# Patient Record
Sex: Female | Born: 1950 | Race: White | Hispanic: No | Smoking: Never smoker
Health system: Southern US, Community
[De-identification: ages and names within clinical notes are randomized; demographics above are authoritative.]

## PROBLEM LIST (undated history)

## (undated) DIAGNOSIS — M1990 Unspecified osteoarthritis, unspecified site: Secondary | ICD-10-CM

## (undated) DIAGNOSIS — I509 Heart failure, unspecified: Secondary | ICD-10-CM

## (undated) DIAGNOSIS — G4730 Sleep apnea, unspecified: Secondary | ICD-10-CM

## (undated) DIAGNOSIS — J45909 Unspecified asthma, uncomplicated: Secondary | ICD-10-CM

## (undated) DIAGNOSIS — K219 Gastro-esophageal reflux disease without esophagitis: Secondary | ICD-10-CM

## (undated) DIAGNOSIS — I2510 Atherosclerotic heart disease of native coronary artery without angina pectoris: Secondary | ICD-10-CM

## (undated) DIAGNOSIS — I10 Essential (primary) hypertension: Secondary | ICD-10-CM

## (undated) DIAGNOSIS — E119 Type 2 diabetes mellitus without complications: Secondary | ICD-10-CM

## (undated) HISTORY — PX: ABDOMINAL HYSTERECTOMY: SHX81

## (undated) HISTORY — PX: TONSILLECTOMY: SUR1361

## (undated) HISTORY — PX: HEMORROIDECTOMY: SUR656

---

## 2015-12-10 ENCOUNTER — Emergency Department (HOSPITAL_COMMUNITY): Payer: Medicare HMO

## 2015-12-10 ENCOUNTER — Inpatient Hospital Stay (HOSPITAL_COMMUNITY)
Admission: EM | Admit: 2015-12-10 | Discharge: 2015-12-12 | DRG: 247 | Disposition: A | Discharge status: Home / Self Care | Payer: Medicare HMO | Attending: Cardiology | Admitting: Cardiology

## 2015-12-10 ENCOUNTER — Encounter (HOSPITAL_COMMUNITY): Payer: Self-pay | Admitting: *Deleted

## 2015-12-10 DIAGNOSIS — R079 Chest pain, unspecified: Secondary | ICD-10-CM | POA: Diagnosis not present

## 2015-12-10 DIAGNOSIS — I214 Non-ST elevation (NSTEMI) myocardial infarction: Principal | ICD-10-CM

## 2015-12-10 DIAGNOSIS — Z885 Allergy status to narcotic agent status: Secondary | ICD-10-CM | POA: Diagnosis not present

## 2015-12-10 DIAGNOSIS — I10 Essential (primary) hypertension: Secondary | ICD-10-CM | POA: Diagnosis present

## 2015-12-10 DIAGNOSIS — Z6839 Body mass index (BMI) 39.0-39.9, adult: Secondary | ICD-10-CM | POA: Diagnosis not present

## 2015-12-10 DIAGNOSIS — Z955 Presence of coronary angioplasty implant and graft: Secondary | ICD-10-CM | POA: Diagnosis not present

## 2015-12-10 DIAGNOSIS — Z880 Allergy status to penicillin: Secondary | ICD-10-CM

## 2015-12-10 DIAGNOSIS — I2510 Atherosclerotic heart disease of native coronary artery without angina pectoris: Secondary | ICD-10-CM

## 2015-12-10 DIAGNOSIS — I25110 Atherosclerotic heart disease of native coronary artery with unstable angina pectoris: Secondary | ICD-10-CM | POA: Diagnosis present

## 2015-12-10 DIAGNOSIS — E876 Hypokalemia: Secondary | ICD-10-CM | POA: Diagnosis present

## 2015-12-10 DIAGNOSIS — K219 Gastro-esophageal reflux disease without esophagitis: Secondary | ICD-10-CM | POA: Diagnosis present

## 2015-12-10 DIAGNOSIS — Z7984 Long term (current) use of oral hypoglycemic drugs: Secondary | ICD-10-CM | POA: Diagnosis not present

## 2015-12-10 DIAGNOSIS — E785 Hyperlipidemia, unspecified: Secondary | ICD-10-CM

## 2015-12-10 DIAGNOSIS — E119 Type 2 diabetes mellitus without complications: Secondary | ICD-10-CM

## 2015-12-10 DIAGNOSIS — Z8249 Family history of ischemic heart disease and other diseases of the circulatory system: Secondary | ICD-10-CM | POA: Diagnosis not present

## 2015-12-10 DIAGNOSIS — M3390 Dermatopolymyositis, unspecified, organ involvement unspecified: Secondary | ICD-10-CM

## 2015-12-10 DIAGNOSIS — Z79899 Other long term (current) drug therapy: Secondary | ICD-10-CM | POA: Diagnosis not present

## 2015-12-10 DIAGNOSIS — Z7982 Long term (current) use of aspirin: Secondary | ICD-10-CM | POA: Diagnosis not present

## 2015-12-10 DIAGNOSIS — E669 Obesity, unspecified: Secondary | ICD-10-CM | POA: Diagnosis present

## 2015-12-10 DIAGNOSIS — M3313 Other dermatomyositis without myopathy: Secondary | ICD-10-CM

## 2015-12-10 HISTORY — DX: Sleep apnea, unspecified: G47.30

## 2015-12-10 HISTORY — DX: Type 2 diabetes mellitus without complications: E11.9

## 2015-12-10 HISTORY — DX: Gastro-esophageal reflux disease without esophagitis: K21.9

## 2015-12-10 HISTORY — DX: Unspecified asthma, uncomplicated: J45.909

## 2015-12-10 HISTORY — DX: Essential (primary) hypertension: I10

## 2015-12-10 HISTORY — DX: Atherosclerotic heart disease of native coronary artery without angina pectoris: I25.10

## 2015-12-10 HISTORY — DX: Heart failure, unspecified: I50.9

## 2015-12-10 HISTORY — DX: Unspecified osteoarthritis, unspecified site: M19.90

## 2015-12-10 LAB — CBC
HEMATOCRIT: 40.6 % (ref 36.0–46.0)
Hemoglobin: 13.3 g/dL (ref 12.0–15.0)
MCH: 28.1 pg (ref 26.0–34.0)
MCHC: 32.8 g/dL (ref 30.0–36.0)
MCV: 85.8 fL (ref 78.0–100.0)
PLATELETS: 287 10*3/uL (ref 150–400)
RBC: 4.73 MIL/uL (ref 3.87–5.11)
RDW: 13.6 % (ref 11.5–15.5)
WBC: 7.6 10*3/uL (ref 4.0–10.5)

## 2015-12-10 LAB — APTT: APTT: 32 s (ref 24–36)

## 2015-12-10 LAB — BASIC METABOLIC PANEL
Anion gap: 9 (ref 5–15)
BUN: 17 mg/dL (ref 6–20)
CO2: 28 mmol/L (ref 22–32)
CREATININE: 1.13 mg/dL — AB (ref 0.44–1.00)
Calcium: 9.6 mg/dL (ref 8.9–10.3)
Chloride: 100 mmol/L — ABNORMAL LOW (ref 101–111)
GFR calc Af Amer: 57 mL/min — ABNORMAL LOW (ref >60)
GFR, EST NON AFRICAN AMERICAN: 50 mL/min — AB (ref >60)
GLUCOSE: 93 mg/dL (ref 65–99)
POTASSIUM: 3.3 mmol/L — AB (ref 3.5–5.1)
SODIUM: 137 mmol/L (ref 135–145)

## 2015-12-10 LAB — TROPONIN I: Troponin I: 0.12 ng/mL (ref <0.03)

## 2015-12-10 LAB — PROTIME-INR
INR: 1.06
Prothrombin Time: 13.8 seconds (ref 11.4–15.2)

## 2015-12-10 LAB — I-STAT TROPONIN, ED: Troponin i, poc: 0.1 ng/mL (ref 0.00–0.08)

## 2015-12-10 MED ORDER — MOMETASONE FURO-FORMOTEROL FUM 200-5 MCG/ACT IN AERO
2.0000 | INHALATION_SPRAY | Freq: Two times a day (BID) | RESPIRATORY_TRACT | Status: DC
  Administered 2015-12-11 – 2015-12-12 (×3): 2 via RESPIRATORY_TRACT
  Filled 2015-12-10 (×2): qty 8.8

## 2015-12-10 MED ORDER — ATORVASTATIN CALCIUM 80 MG PO TABS
80.0000 mg | ORAL_TABLET | Freq: Every day | ORAL | Status: DC
  Administered 2015-12-10 – 2015-12-11 (×2): 80 mg via ORAL
  Filled 2015-12-10 (×2): qty 1

## 2015-12-10 MED ORDER — ASPIRIN EC 81 MG PO TBEC: 81.0000 mg | DELAYED_RELEASE_TABLET | Freq: Every day | ORAL | Status: DC

## 2015-12-10 MED ORDER — POTASSIUM CHLORIDE CRYS ER 20 MEQ PO TBCR
40.0000 meq | EXTENDED_RELEASE_TABLET | Freq: Once | ORAL | Status: AC
  Administered 2015-12-10: 40 meq via ORAL
  Filled 2015-12-10: qty 2

## 2015-12-10 MED ORDER — CARVEDILOL 25 MG PO TABS
25.0000 mg | ORAL_TABLET | Freq: Two times a day (BID) | ORAL | Status: DC
  Administered 2015-12-10 – 2015-12-12 (×4): 25 mg via ORAL
  Filled 2015-12-10: qty 1
  Filled 2015-12-10 (×3): qty 2

## 2015-12-10 MED ORDER — HEPARIN (PORCINE) IN NACL 100-0.45 UNIT/ML-% IJ SOLN
1100.0000 [IU]/h | INTRAMUSCULAR | Status: DC
  Administered 2015-12-10: 1100 [IU]/h via INTRAVENOUS
  Filled 2015-12-10: qty 250

## 2015-12-10 MED ORDER — ONDANSETRON HCL 4 MG/2ML IJ SOLN: 4.0000 mg | Freq: Four times a day (QID) | INTRAMUSCULAR | Status: DC | PRN

## 2015-12-10 MED ORDER — SODIUM CHLORIDE 0.9% FLUSH: 3.0000 mL | Freq: Two times a day (BID) | INTRAVENOUS | Status: DC

## 2015-12-10 MED ORDER — ACETAMINOPHEN 325 MG PO TABS: 650.0000 mg | ORAL_TABLET | ORAL | Status: DC | PRN

## 2015-12-10 MED ORDER — NITROGLYCERIN 0.4 MG SL SUBL: 0.4000 mg | SUBLINGUAL_TABLET | SUBLINGUAL | Status: DC | PRN

## 2015-12-10 MED ORDER — SODIUM CHLORIDE 0.9 % IV SOLN: 250.0000 mL | INTRAVENOUS | Status: DC | PRN

## 2015-12-10 MED ORDER — FLUOXETINE HCL 20 MG PO TABS
40.0000 mg | ORAL_TABLET | ORAL | Status: DC
  Administered 2015-12-11 – 2015-12-12 (×2): 40 mg via ORAL
  Filled 2015-12-10 (×4): qty 2

## 2015-12-10 MED ORDER — HEPARIN BOLUS VIA INFUSION
4000.0000 [IU] | Freq: Once | INTRAVENOUS | Status: AC
Start: 2015-12-10 — End: 2015-12-10
  Administered 2015-12-10: 4000 [IU] via INTRAVENOUS
  Filled 2015-12-10: qty 4000

## 2015-12-10 MED ORDER — ASPIRIN 81 MG PO TBEC: 81.0000 mg | DELAYED_RELEASE_TABLET | Freq: Every day | ORAL | Status: DC

## 2015-12-10 MED ORDER — SODIUM CHLORIDE 0.9 % WEIGHT BASED INFUSION
1.0000 mL/kg/h | INTRAVENOUS | Status: DC
  Administered 2015-12-10: 1 mL/kg/h via INTRAVENOUS

## 2015-12-10 MED ORDER — ASPIRIN 81 MG PO CHEW
324.0000 mg | CHEWABLE_TABLET | Freq: Once | ORAL | Status: AC
  Administered 2015-12-10: 324 mg via ORAL
  Filled 2015-12-10: qty 4

## 2015-12-10 MED ORDER — SODIUM CHLORIDE 0.9% FLUSH: 3.0000 mL | INTRAVENOUS | Status: DC | PRN

## 2015-12-10 MED ORDER — ASPIRIN 81 MG PO CHEW
81.0000 mg | CHEWABLE_TABLET | ORAL | Status: AC
  Administered 2015-12-11: 81 mg via ORAL
  Filled 2015-12-10: qty 1

## 2015-12-10 NOTE — ED Triage Notes (Signed)
Pt arrives to ED c/o chest aching since last Wednesday. States that it got worse today. States she has radiation to he left arm and when the pain is worse it radiates to her back.

## 2015-12-10 NOTE — Progress Notes (Signed)
ANTICOAGULATION CONSULT NOTE - Initial Consult  Pharmacy Consult for heparin Indication: chest pain/ACS  Allergies  Allergen Reactions  . Codeine     "get a little crazy"  . Penicillins Rash    Has patient had a PCN reaction causing immediate rash, facial/tongue/throat swelling, SOB or lightheadedness with hypotension: Yes Has patient had a PCN reaction causing severe rash involving mucus membranes or skin necrosis: No Has patient had a PCN reaction that required hospitalization No Has patient had a PCN reaction occurring within the last 10 years: No If all of the above answers are "NO", then may proceed with Cephalosporin use.     Patient Measurements: Height: 5\' 6"  (167.6 cm) Weight: 246 lb (111.6 kg) IBW/kg (Calculated) : 59.3 Heparin Dosing Weight: 85  Vital Signs: Temp: 97.6 F (36.4 C) (08/03 1646) Temp Source: Oral (08/03 1646) BP: 159/97 (08/03 1646) Pulse Rate: 79 (08/03 1900)  Labs:  Recent Labs  12/10/15 1657  HGB 13.3  HCT 40.6  PLT 287  CREATININE 1.13*    Estimated Creatinine Clearance: 62 mL/min (by C-G formula based on SCr of 1.13 mg/dL).   Medical History: Past Medical History:  Diagnosis Date  . Asthma   . CHF (congestive heart failure) (HCC)   . Diabetes mellitus without complication (HCC)   . GERD (gastroesophageal reflux disease)   . Hypertension   . Sleep apnea    Assessment: 89 yoF admitted with CP concern ACS/UA. Pharmacy to dose heparin. No known anticoagulation PTA. CBC stable.   Goal of Therapy:  Heparin level 0.3-0.7 units/ml Monitor platelets by anticoagulation protocol: Yes   Plan:  1. Give 4000 units bolus x 1 2. Start heparin infusion at 1100 units/hr 3. Check anti-Xa level in 6 hours and daily while on heparin 4. Continue to monitor H&H and platelets  Pollyann Samples, PharmD, BCPS 12/10/2015, 8:22 PM Pager: [phone]

## 2015-12-10 NOTE — H&P (Signed)
Patient ID: Jane Hall MRN: [id number], DOB/AGE: 1951-03-29   Admit date: 12/10/2015   Primary Physician: No primary care provider on file. Primary Cardiologist: New  Pt. Profile:  65 year old female with history of CAD, hypertension, hyperlipidemia and diabetes presenting to the [facility] emergency department with chest pain consistent with unstable angina and an elevated troponin level.  Problem List  Past Medical History:  Diagnosis Date  . Asthma   . CHF (congestive heart failure) (HCC)   . Diabetes mellitus without complication (HCC)   . GERD (gastroesophageal reflux disease)   . Hypertension   . Sleep apnea     Past Surgical History:  Procedure Laterality Date  . ABDOMINAL HYSTERECTOMY    . CESAREAN SECTION    . HEMORROIDECTOMY    . TONSILLECTOMY       Allergies  Allergies  Allergen Reactions  . Codeine     "get a little crazy"  . Penicillins Rash    Has patient had a PCN reaction causing immediate rash, facial/tongue/throat swelling, SOB or lightheadedness with hypotension: Yes Has patient had a PCN reaction causing severe rash involving mucus membranes or skin necrosis: No Has patient had a PCN reaction that required hospitalization No Has patient had a PCN reaction occurring within the last 10 years: No If all of the above answers are "NO", then may proceed with Cephalosporin use.     HPI  65 year old female with history of CAD, hypertension, hyperlipidemia and diabetes presenting to the [facility] emergency department with chest pain consistent with unstable angina and an elevated troponin level.  [place]. She reports that she has a prior history of Angioplasty only, vessel unknown. She reports that over the last several days, she has experienced exertional chest discomfort. Described as substernal chest pressure radiating to her jaw  and left arm. This has also been associated with exertional dyspnea and diaphoresis. Today she had a recurrent episode that was more severe and lasted for about 30 minutes, prompting her to report to the [facility] emergency department. In the ED, point care troponin is abnormal at 0.10. Actual serum lab troponin is pending. EKG shows sinus rhythm with borderline T-wave abnormalities in the lateral leads. There are no previous EKGs for comparison. She was given aspirin and is being started on IV heparin. She is currently chest pain-free.  Home Medications  Prior to Admission medications   Medication Sig Start Date End Date Taking? Authorizing Provider  aspirin (ASPIRIN EC) 81 MG EC tablet Take 81 mg by mouth at bedtime. Swallow whole.   Yes Historical Provider, MD  Calcium Carbonate-Vitamin D (CALCIUM 500 + D PO) Take 1 tablet by mouth 2 (two) times daily.   Yes Historical Provider, MD  carvedilol (COREG) 25 MG tablet Take 25 mg by mouth 2 (two) times daily with a meal.   Yes Historical Provider, MD  cetirizine (ZYRTEC) 10 MG tablet Take 10 mg by mouth at bedtime.   Yes Historical Provider, MD  FLUoxetine (PROZAC) 20 MG tablet Take 40 mg by mouth every morning.   Yes Historical Provider, MD  fluticasone (FLONASE) 50 MCG/ACT nasal spray Place 1 spray into both nostrils daily as needed for allergies or rhinitis.   Yes Historical Provider, MD  Fluticasone-Salmeterol (ADVAIR) 250-50 MCG/DOSE AEPB Inhale 1 puff into the lungs 2 (two) times daily.   Yes Historical Provider, MD  lisinopril (PRINIVIL,ZESTRIL) 40 MG tablet Take 40 mg by mouth every evening.   Yes Historical Provider, MD  metFORMIN (GLUCOPHAGE) 500 MG tablet Take 500 mg by mouth daily with breakfast.   Yes Historical Provider, MD  Multiple Vitamins-Minerals (ONE-A-DAY 50 PLUS PO) Take 1 tablet by mouth daily.   Yes Historical Provider, MD  naproxen sodium (ANAPROX) 220 MG tablet Take 440 mg by mouth 2 (two) times daily with a meal.   Yes  Historical Provider, MD  pravastatin (PRAVACHOL) 40 MG tablet Take 40 mg by mouth every evening.   Yes Historical Provider, MD    Family History  Family History  Problem Relation Age of Onset  . Hypertension Mother     Social History  Social History   Social History  . Marital status: Divorced    Spouse name: N/A  . Number of children: N/A  . Years of education: N/A   Occupational History  . Not on file.   Social History Main Topics  . Smoking status: Never Smoker  . Smokeless tobacco: Never Used  . Alcohol use Yes     Comment: social  . Drug use: No  . Sexual activity: Not on file   Other Topics Concern  . Not on file   Social History Narrative  . No narrative on file     Review of Systems General:  No chills, fever, night sweats or weight changes.  Cardiovascular:  No chest pain, dyspnea on exertion, edema, orthopnea, palpitations, paroxysmal nocturnal dyspnea. Dermatological: No rash, lesions/masses Respiratory: No cough, dyspnea Urologic: No hematuria, dysuria Abdominal:   No nausea, vomiting, diarrhea, bright red blood per rectum, melena, or hematemesis Neurologic:  No visual changes, wkns, changes in mental status. All other systems reviewed and are otherwise negative except as noted above.  Physical Exam  Blood pressure 159/97, pulse 79, temperature 97.6 F (36.4 C), temperature source Oral, resp. rate 13, SpO2 97 %.  General: Pleasant, NAD Psych: Normal affect. Neuro: Alert and oriented X 3. Moves all extremities spontaneously. HEENT: Normal  Neck: Supple without bruits or JVD. Lungs:  Resp regular and unlabored, CTA. Heart: RRR no s3, s4, or murmurs. Abdomen: Soft, non-tender, non-distended, BS + x 4. Obese Extremities: No clubbing, cyanosis or edema. DP/PT/Radials 2+ and equal bilaterally.  Labs  Troponin [facility] Test)  Recent Labs  12/10/15 1731  TROPIPOC 0.10*   No results for input(s): CKTOTAL, CKMB, TROPONINI in the last 72  hours. Lab Results  Component Value Date   WBC 7.6 12/10/2015   HGB 13.3 12/10/2015   HCT 40.6 12/10/2015   MCV 85.8 12/10/2015   PLT 287 12/10/2015     Recent Labs Lab 12/10/15 1657  NA 137  K 3.3*  CL 100*  CO2 28  BUN 17  CREATININE 1.13*  CALCIUM 9.6  GLUCOSE 93   No results found for: CHOL, HDL, LDLCALC, TRIG No results found for: DDIMER   Radiology/Studies  Dg Chest 2 View  Result Date: 12/10/2015 CLINICAL DATA:  Chest pain. EXAM: CHEST  2 VIEW COMPARISON:  None. FINDINGS: Normal sized heart. Clear lungs. Small calcified granuloma in the lingula. Mild thoracic spine degenerative changes. IMPRESSION: No acute abnormality. Electronically Signed   By: Beckie Salts M.D.   On: 12/10/2015 18:07    ECG  Sinus rhythm with T-wave abnormalities in the lateral leads. No previous EKGs for comparison    ASSESSMENT AND PLAN  Principal Problem:   NSTEMI (non-ST elevated myocardial infarction) [facility]) Active Problems:  CAD (coronary artery disease)   HTN (hypertension)   HLD (hyperlipidemia)   Diabetes mellitus, type 2 (HCC)   1. NSTEMI: Patient with symptomatology and enzyme elevation consistent with non-STEMI. She reports prior history of CAD and has undergone stent placement in the past, vessel unknown. She also has a history of hypertension LHD and diabetes. Her EKG shows borderline T-wave abnormalities in the lateral leads. No prior EKGs for comparison. She is currently chest pain-free. We will admit to telemetry. Continue IV heparin. Cycle cardiac enzymes to assess troponin trend. NPO at midnight. Left heart cath in the morning. BMP shows mild renal insufficiency with a serum creatinine of 1.13. We will hold her ACE-I. Also hold metformin. Overnight hydration. Continue beta blocker, aspirin and statin. Check a fasting lipid panel in the a.m. as well as 2-D echocardiogram. She is hypokalemic with potassium level 3.3. Will order supplemental potassium. Repeat BMP in the am.      Signed, Robbie Lis, PA-C 12/10/2015, 8:03 PM  Personally seen and examined. Agree with above.  65 year old female visiting from [place] with coronary artery disease status post prior angioplasty only and small inferior vessel too small for angioplasty seen on prior heart catheterizations last one 5 years ago with diabetes, hypertension, hyperlipidemia, obesity admitted with exertional chest discomfort with radiation to neck and left arm, 8/10 with associated diaphoresis and shortness of breath lasting 30 minutes duration currently chest pain-free with troponin of 0.1 mildly elevated and mild ST segment depression in the lateral leads consistent with non-ST elevation myocardial infarction.  Non-ST elevation myocardial infarction  - Nothing by mouth past midnight for cardiac catheterization. Risks and benefits have been discussed including stroke, heart attack, death, renal impairment, bleeding.  - Holding lisinopril  - IV heparin, beta blocker (carvedilol at home)  - Statin change from pravastatin over to atorvastatin. She was previously able to tolerate atorvastatin but could not tolerate simvastatin.  - Aspirin has been administered in the emergency room. We will continue  - Family member with her in the emergency room is an ICU nurse in [place]. They are in town for her grandson swim meet, nationals at the [place].  - She has had prior heart catheterizations possibly 5 of them over the last several years she states with one angioplasty performed, no prior stents, and remembering that she had a small vessel which was not amenable to PCI on the backside of her heart previously. Her prior heart catheterizations were prompted by chest pain. Last angiogram took place a little over 5 years ago when she was still working. She does not recall having a prior heart attack.  - Her family member who is an ICU nurse listen to her earlier today and noted that she heard about 10  beats of fast heart rhythm that she was symptomatic with.  Obesity  - Encourage weight loss  Hypertension  - Elevated currently, may need to increase medications.  Diabetes  - Continue home regimen  Nonsmoker  Very strong family history of multiple family members with early coronary artery disease  Exam demonstrates regular rate and rhythm, no significant murmurs, normal radial pulse, normal lung fields, obesity, no significant lower extremity edema, right lower leg bruise after falling last week over a fence trying to help her dog.  Donato Schultz, MD

## 2015-12-10 NOTE — ED Notes (Signed)
Trop of 0.10 given to Isac, MD and Vickki Muff

## 2015-12-10 NOTE — ED Provider Notes (Signed)
[facility]-EMERGENCY DEPT Provider Note   CSN: [id number] Arrival date & time: 12/10/15  1632  First Provider Contact:  First MD Initiated Contact with Patient 12/10/15 1846        History   Chief Complaint Chief Complaint  Patient presents with  . Chest Pain    HPI Jane Hall is a 65 y.o. female.  HPI 53 old female who presents with intermittent chest pain. She has a history of hypertension, hyperlipidemia, CHF, and CAD with prior angioplasty. Prior cardiology care done in [place], and she is visiting [place] with family. States that over the past one week she has had exertional chest pain. Walking from the parking lot into the casino or store, and walking across a shopping mall, she has experienced chest pressure, diaphoresis, pain radiating down to the left arm into the left jaw, and shortness of breath. Symptoms typically lasting 20 minutes and resolved with rest. Has had worsening symptoms today, and presents the ED for evaluation. No increasing lower extremity edema, orthopnea or PND. No fevers, chills, cough. On arrival, states that with rest she is chest pain-free.  Past Medical History:  Diagnosis Date  . Asthma   . CHF (congestive heart failure) (HCC)   . Diabetes mellitus without complication (HCC)   . GERD (gastroesophageal reflux disease)   . Hypertension   . Sleep apnea     There are no active problems to display for this patient.   Past Surgical History:  Procedure Laterality Date  . ABDOMINAL HYSTERECTOMY    . CESAREAN SECTION    . HEMORROIDECTOMY    . TONSILLECTOMY      OB History    No data available       Home Medications    Prior to Admission medications   Medication Sig Start Date End Date Taking? Authorizing Provider  aspirin (ASPIRIN EC) 81 MG EC tablet Take 81 mg by mouth at bedtime. Swallow whole.   Yes Historical Provider, MD  Calcium Carbonate-Vitamin D (CALCIUM 500 + D PO) Take 1 tablet by mouth 2 (two) times daily.   Yes  Historical Provider, MD  carvedilol (COREG) 25 MG tablet Take 25 mg by mouth 2 (two) times daily with a meal.   Yes Historical Provider, MD  cetirizine (ZYRTEC) 10 MG tablet Take 10 mg by mouth at bedtime.   Yes Historical Provider, MD  FLUoxetine (PROZAC) 20 MG tablet Take 40 mg by mouth every morning.   Yes Historical Provider, MD  fluticasone (FLONASE) 50 MCG/ACT nasal spray Place 1 spray into both nostrils daily as needed for allergies or rhinitis.   Yes Historical Provider, MD  Fluticasone-Salmeterol (ADVAIR) 250-50 MCG/DOSE AEPB Inhale 1 puff into the lungs 2 (two) times daily.   Yes Historical Provider, MD  lisinopril (PRINIVIL,ZESTRIL) 40 MG tablet Take 40 mg by mouth every evening.   Yes Historical Provider, MD  metFORMIN (GLUCOPHAGE) 500 MG tablet Take 500 mg by mouth daily with breakfast.   Yes Historical Provider, MD  Multiple Vitamins-Minerals (ONE-A-DAY 50 PLUS PO) Take 1 tablet by mouth daily.   Yes Historical Provider, MD  naproxen sodium (ANAPROX) 220 MG tablet Take 440 mg by mouth 2 (two) times daily with a meal.   Yes Historical Provider, MD  pravastatin (PRAVACHOL) 40 MG tablet Take 40 mg by mouth every evening.   Yes Historical Provider, MD    Family History No family history on file.  Social History Social History  Substance Use Topics  . Smoking status: Never Smoker  .  Smokeless tobacco: Never Used  . Alcohol use Yes     Comment: social     Allergies   Codeine and Penicillins   Review of Systems Review of Systems 10/14 systems reviewed and are negative other than those stated in the HPI   Physical Exam Updated Vital Signs BP 159/97 (BP Location: Left Arm)   Pulse 79   Temp 97.6 F (36.4 C) (Oral)   Resp 13   LMP  (LMP Unknown)   SpO2 97%   Physical Exam Physical Exam  Nursing note and vitals reviewed. Constitutional: Well developed, well nourished, non-toxic, and in no acute distress Head: Normocephalic and atraumatic.  Mouth/Throat: Oropharynx  is clear and moist.  Neck: Normal range of motion. Neck supple.  Cardiovascular: Normal rate and regular rhythm.   Pulmonary/Chest: Effort normal and breath sounds normal.  Abdominal: Soft. There is no tenderness. There is no rebound and no guarding.  Musculoskeletal: Normal range of motion.  Neurological: Alert, no facial droop, fluent speech, moves all extremities symmetrically Skin: Skin is warm and dry.  Psychiatric: Cooperative   ED Treatments / Results  Labs (all labs ordered are listed, but only abnormal results are displayed) Labs Reviewed  BASIC METABOLIC PANEL - Abnormal; Notable for the following:       Result Value   Potassium 3.3 (*)    Chloride 100 (*)    Creatinine, Ser 1.13 (*)    GFR calc non Af Amer 50 (*)    GFR calc Af Amer 57 (*)    All other components within normal limits  I-STAT TROPOININ, ED - Abnormal; Notable for the following:    Troponin i, poc 0.10 (*)    All other components within normal limits  CBC  PROTIME-INR  APTT    EKG  EKG Interpretation  Date/Time:  Thursday December 10 2015 16:40:53 EDT Ventricular Rate:  70 PR Interval:  170 QRS Duration: 88 QT Interval:  452 QTC Calculation: 488 R Axis:   65 Text Interpretation:  Normal sinus rhythm Low voltage QRS Cannot rule out Anterior infarct , age undetermined Abnormal ECG Confirmed by Erma Heritage MD, [place] [phone]) on 12/10/2015 5:47:29 PM       Radiology Dg Chest 2 View  Result Date: 12/10/2015 CLINICAL DATA:  Chest pain. EXAM: CHEST  2 VIEW COMPARISON:  None. FINDINGS: Normal sized heart. Clear lungs. Small calcified granuloma in the lingula. Mild thoracic spine degenerative changes. IMPRESSION: No acute abnormality. Electronically Signed   By: Beckie Salts M.D.   On: 12/10/2015 18:07    Procedures Procedures (including critical care time)  Medications Ordered in ED Medications  aspirin chewable tablet 324 mg (not administered)   CRITICAL CARE Performed by: Lavera Guise  ?  Total critical care time: 35 minutes  Critical care time was exclusive of separately billable procedures and treating other patients.  Critical care was necessary to treat or prevent imminent or life-threatening deterioration.  Critical care was time spent personally by me on the following activities: development of treatment plan with patient and/or surrogate as well as nursing, discussions with consultants, evaluation of patient's response to treatment, examination of patient, obtaining history from patient or surrogate, ordering and performing treatments and interventions, ordering and review of laboratory studies, ordering and review of radiographic studies, pulse oximetry and re-evaluation of patient's condition.   Initial Impression / Assessment and Plan / ED Course  I have reviewed the triage vital signs and the nursing notes.  Pertinent labs & imaging results  that were available during my care of the patient were reviewed by me and considered in my medical decision making (see chart for details).  Clinical Course    85 her old female with history of CAD status post angioplasty who presents with exertional chest pain and shortness of breath. On presentation is chest pain-free. Vital signs are stable. She is nontoxic and in no acute distress. No evidence of fluid overload. EKG with depressions in the inferior and lateral leads and T-wave inversion anteriorly. She has an elevated troponin of 0.10. Chest x-ray without acute cardiopulmonary processes. Symptoms concerning for NSTEMI. She is given a full dose of aspirin and started on heparin. Discussed with Dr. Anne Fu from cardiology who will admit him to the cardiology service for ongoing management.  Final Clinical Impressions(s) / ED Diagnoses   Final diagnoses:  NSTEMI (non-ST elevated myocardial infarction) [facility])    New Prescriptions New Prescriptions   No medications on file     Lavera Guise, MD 12/10/15 1947

## 2015-12-11 ENCOUNTER — Inpatient Hospital Stay (HOSPITAL_COMMUNITY): Payer: Medicare HMO

## 2015-12-11 ENCOUNTER — Encounter (HOSPITAL_COMMUNITY)
Admission: EM | Disposition: A | Discharge status: Home / Self Care | Payer: Self-pay | Source: Home / Self Care | Attending: Cardiology

## 2015-12-11 ENCOUNTER — Encounter (HOSPITAL_COMMUNITY): Payer: Self-pay | Admitting: General Practice

## 2015-12-11 DIAGNOSIS — R079 Chest pain, unspecified: Secondary | ICD-10-CM

## 2015-12-11 DIAGNOSIS — E876 Hypokalemia: Secondary | ICD-10-CM

## 2015-12-11 HISTORY — PX: CORONARY STENT PLACEMENT: SHX1402

## 2015-12-11 HISTORY — PX: CARDIAC CATHETERIZATION: SHX172

## 2015-12-11 LAB — ECHOCARDIOGRAM COMPLETE
CHL CUP MV DEC (S): 194
E decel time: 194 msec
FS: 29 % (ref 28–44)
HEIGHTINCHES: 66 in
IV/PV OW: 1.1
LA ID, A-P, ES: 33 mm
LA vol index: 23.4 mL/m2
LA vol: 51.2 mL
LADIAMINDEX: 1.51 cm/m2
LAVOLA4C: 45.9 mL
LDCA: 3.46 cm2
LEFT ATRIUM END SYS DIAM: 33 mm
LV PW d: 10 mm — AB (ref 0.6–1.1)
LV TDI E'LATERAL: 10
LV TDI E'MEDIAL: 8.05
LVELAT: 10 cm/s
LVOT diameter: 21 mm
MV pk E vel: 1.1 m/s
RV LATERAL S' VELOCITY: 15.7 cm/s
WEIGHTICAEL: 3955.2 [oz_av]

## 2015-12-11 LAB — BASIC METABOLIC PANEL
ANION GAP: 8 (ref 5–15)
BUN: 15 mg/dL (ref 6–20)
CALCIUM: 9.3 mg/dL (ref 8.9–10.3)
CO2: 27 mmol/L (ref 22–32)
CREATININE: 0.92 mg/dL (ref 0.44–1.00)
Chloride: 106 mmol/L (ref 101–111)
GFR calc non Af Amer: >60 mL/min (ref >60)
Glucose, Bld: 104 mg/dL — ABNORMAL HIGH (ref 65–99)
Potassium: 3.3 mmol/L — ABNORMAL LOW (ref 3.5–5.1)
SODIUM: 141 mmol/L (ref 135–145)

## 2015-12-11 LAB — LIPID PANEL
CHOLESTEROL: 165 mg/dL (ref 0–200)
HDL: 41 mg/dL (ref >40)
LDL Cholesterol: 73 mg/dL (ref 0–99)
Total CHOL/HDL Ratio: 4 RATIO
Triglycerides: 257 mg/dL — ABNORMAL HIGH (ref <150)
VLDL: 51 mg/dL — ABNORMAL HIGH (ref 0–40)

## 2015-12-11 LAB — HEPARIN LEVEL (UNFRACTIONATED): Heparin Unfractionated: 0.41 IU/mL (ref 0.30–0.70)

## 2015-12-11 LAB — CBC
HCT: 40.2 % (ref 36.0–46.0)
HEMOGLOBIN: 12.9 g/dL (ref 12.0–15.0)
MCH: 27.1 pg (ref 26.0–34.0)
MCHC: 32.1 g/dL (ref 30.0–36.0)
MCV: 84.5 fL (ref 78.0–100.0)
PLATELETS: 274 10*3/uL (ref 150–400)
RBC: 4.76 MIL/uL (ref 3.87–5.11)
RDW: 13.4 % (ref 11.5–15.5)
WBC: 7.2 10*3/uL (ref 4.0–10.5)

## 2015-12-11 LAB — PROTIME-INR
INR: 1.07
PROTHROMBIN TIME: 14 s (ref 11.4–15.2)

## 2015-12-11 LAB — POCT ACTIVATED CLOTTING TIME
ACTIVATED CLOTTING TIME: 252 s
ACTIVATED CLOTTING TIME: 346 s

## 2015-12-11 LAB — GLUCOSE, CAPILLARY
GLUCOSE-CAPILLARY: 99 mg/dL (ref 65–99)
GLUCOSE-CAPILLARY: 109 mg/dL — AB (ref 65–99)
Glucose-Capillary: 95 mg/dL (ref 65–99)
Glucose-Capillary: 128 mg/dL — ABNORMAL HIGH (ref 65–99)

## 2015-12-11 LAB — TROPONIN I
TROPONIN I: 0.1 ng/mL — AB (ref <0.03)
Troponin I: 0.09 ng/mL (ref <0.03)

## 2015-12-11 SURGERY — LEFT HEART CATH AND CORONARY ANGIOGRAPHY
Anesthesia: LOCAL

## 2015-12-11 MED ORDER — TICAGRELOR 90 MG PO TABS
ORAL_TABLET | ORAL | Status: AC
  Filled 2015-12-11: qty 2

## 2015-12-11 MED ORDER — MIDAZOLAM HCL 2 MG/2ML IJ SOLN
INTRAMUSCULAR | Status: DC | PRN
  Administered 2015-12-11: 1 mg via INTRAVENOUS
  Administered 2015-12-11: 2 mg via INTRAVENOUS

## 2015-12-11 MED ORDER — HEPARIN SODIUM (PORCINE) 1000 UNIT/ML IJ SOLN
INTRAMUSCULAR | Status: AC
  Filled 2015-12-11: qty 1

## 2015-12-11 MED ORDER — ONDANSETRON HCL 4 MG/2ML IJ SOLN
4.0000 mg | Freq: Four times a day (QID) | INTRAMUSCULAR | Status: DC | PRN
  Administered 2015-12-11: 19:00:00 4 mg via INTRAVENOUS
  Filled 2015-12-11: qty 2

## 2015-12-11 MED ORDER — ASPIRIN 81 MG PO CHEW
81.0000 mg | CHEWABLE_TABLET | Freq: Every day | ORAL | Status: DC
Start: 2015-12-12 — End: 2015-12-12
  Administered 2015-12-12: 81 mg via ORAL
  Filled 2015-12-11 (×2): qty 1

## 2015-12-11 MED ORDER — TIROFIBAN HCL IN NACL 5-0.9 MG/100ML-% IV SOLN
INTRAVENOUS | Status: AC
  Filled 2015-12-11: qty 100

## 2015-12-11 MED ORDER — HYDRALAZINE HCL 20 MG/ML IJ SOLN
10.0000 mg | INTRAMUSCULAR | Status: DC | PRN
  Administered 2015-12-11: 20 mg via INTRAVENOUS
  Filled 2015-12-11: qty 1

## 2015-12-11 MED ORDER — IOPAMIDOL (ISOVUE-370) INJECTION 76%
INTRAVENOUS | Status: DC | PRN
  Administered 2015-12-11: 195 mL

## 2015-12-11 MED ORDER — TIROFIBAN HCL IN NACL 5-0.9 MG/100ML-% IV SOLN
INTRAVENOUS | Status: DC | PRN
  Administered 2015-12-11: 0.15 ug/kg/min via INTRAVENOUS

## 2015-12-11 MED ORDER — IOPAMIDOL (ISOVUE-370) INJECTION 76%
INTRAVENOUS | Status: AC
  Filled 2015-12-11: qty 100

## 2015-12-11 MED ORDER — TIROFIBAN HCL IN NACL 5-0.9 MG/100ML-% IV SOLN
0.1500 ug/kg/min | INTRAVENOUS | Status: AC
  Filled 2015-12-11: qty 100

## 2015-12-11 MED ORDER — VERAPAMIL HCL 2.5 MG/ML IV SOLN
INTRAVENOUS | Status: AC
  Filled 2015-12-11: qty 2

## 2015-12-11 MED ORDER — SODIUM CHLORIDE 0.9 % WEIGHT BASED INFUSION
1.0000 mL/kg/h | INTRAVENOUS | Status: AC
  Administered 2015-12-11: 10:00:00 1 mL/kg/h via INTRAVENOUS

## 2015-12-11 MED ORDER — SODIUM CHLORIDE 0.9% FLUSH
3.0000 mL | Freq: Two times a day (BID) | INTRAVENOUS | Status: DC
  Administered 2015-12-11 – 2015-12-12 (×2): 3 mL via INTRAVENOUS

## 2015-12-11 MED ORDER — LIDOCAINE HCL (PF) 1 % IJ SOLN
INTRAMUSCULAR | Status: DC | PRN
  Administered 2015-12-11: 5 mL

## 2015-12-11 MED ORDER — LIDOCAINE HCL (PF) 1 % IJ SOLN
INTRAMUSCULAR | Status: AC
  Filled 2015-12-11: qty 30

## 2015-12-11 MED ORDER — HEPARIN SODIUM (PORCINE) 1000 UNIT/ML IJ SOLN
INTRAMUSCULAR | Status: DC | PRN
  Administered 2015-12-11: 5000 [IU] via INTRAVENOUS
  Administered 2015-12-11: 5500 [IU] via INTRAVENOUS
  Administered 2015-12-11: 2000 [IU] via INTRAVENOUS

## 2015-12-11 MED ORDER — SODIUM CHLORIDE 0.9 % IV SOLN: 250.0000 mL | INTRAVENOUS | Status: DC | PRN

## 2015-12-11 MED ORDER — FENTANYL CITRATE (PF) 100 MCG/2ML IJ SOLN
INTRAMUSCULAR | Status: AC
  Filled 2015-12-11: qty 2

## 2015-12-11 MED ORDER — VERAPAMIL HCL 2.5 MG/ML IV SOLN
INTRAVENOUS | Status: DC | PRN
  Administered 2015-12-11: 10 mL via INTRA_ARTERIAL

## 2015-12-11 MED ORDER — TIROFIBAN (AGGRASTAT) BOLUS VIA INFUSION
INTRAVENOUS | Status: DC | PRN
  Administered 2015-12-11: 2802.5 ug via INTRAVENOUS

## 2015-12-11 MED ORDER — TICAGRELOR 90 MG PO TABS
90.0000 mg | ORAL_TABLET | Freq: Two times a day (BID) | ORAL | Status: DC
  Administered 2015-12-11 – 2015-12-12 (×2): 90 mg via ORAL
  Filled 2015-12-11 (×2): qty 1

## 2015-12-11 MED ORDER — POTASSIUM CHLORIDE CRYS ER 20 MEQ PO TBCR
40.0000 meq | EXTENDED_RELEASE_TABLET | Freq: Once | ORAL | Status: AC
  Administered 2015-12-11: 40 meq via ORAL
  Filled 2015-12-11: qty 2

## 2015-12-11 MED ORDER — TICAGRELOR 90 MG PO TABS
ORAL_TABLET | ORAL | Status: DC | PRN
  Administered 2015-12-11: 180 mg via ORAL

## 2015-12-11 MED ORDER — TICAGRELOR 90 MG PO TABS
ORAL_TABLET | ORAL | Status: AC
  Filled 2015-12-11: qty 1

## 2015-12-11 MED ORDER — HYDRALAZINE HCL 20 MG/ML IJ SOLN
10.0000 mg | Freq: Once | INTRAMUSCULAR | Status: AC
  Administered 2015-12-11: 16:00:00 10 mg via INTRAVENOUS
  Filled 2015-12-11: qty 1

## 2015-12-11 MED ORDER — SODIUM CHLORIDE 0.9% FLUSH: 3.0000 mL | INTRAVENOUS | Status: DC | PRN

## 2015-12-11 MED ORDER — HEPARIN (PORCINE) IN NACL 2-0.9 UNIT/ML-% IJ SOLN
INTRAMUSCULAR | Status: AC
  Filled 2015-12-11: qty 1000

## 2015-12-11 MED ORDER — HEPARIN (PORCINE) IN NACL 2-0.9 UNIT/ML-% IJ SOLN
INTRAMUSCULAR | Status: DC | PRN
  Administered 2015-12-11: 09:00:00

## 2015-12-11 MED ORDER — FENTANYL CITRATE (PF) 100 MCG/2ML IJ SOLN
INTRAMUSCULAR | Status: DC | PRN
  Administered 2015-12-11 (×2): 25 ug via INTRAVENOUS

## 2015-12-11 MED ORDER — MIDAZOLAM HCL 2 MG/2ML IJ SOLN
INTRAMUSCULAR | Status: AC
  Filled 2015-12-11: qty 2

## 2015-12-11 MED ORDER — BIVALIRUDIN 250 MG IV SOLR: INTRAVENOUS | Status: DC | PRN

## 2015-12-11 MED ORDER — ACETAMINOPHEN 325 MG PO TABS
650.0000 mg | ORAL_TABLET | ORAL | Status: DC | PRN
  Administered 2015-12-11 – 2015-12-12 (×2): 650 mg via ORAL
  Filled 2015-12-11 (×2): qty 2

## 2015-12-11 SURGICAL SUPPLY — 22 items
BALLN EMERGE MR 2.5X15 (BALLOONS) ×2
BALLN ~~LOC~~ EMERGE MR 3.75X20 (BALLOONS) ×2
BALLOON EMERGE MR 2.5X15 (BALLOONS) ×1 IMPLANT
BALLOON ~~LOC~~ EMERGE MR 3.75X20 (BALLOONS) ×1 IMPLANT
CATH INFINITI 5 FR JL3.5 (CATHETERS) ×2 IMPLANT
CATH INFINITI 5FR ANG PIGTAIL (CATHETERS) ×2 IMPLANT
CATH INFINITI 5FR JL4 (CATHETERS) ×2 IMPLANT
CATH INFINITI JR4 5F (CATHETERS) ×2 IMPLANT
DEVICE RAD COMP TR BAND LRG (VASCULAR PRODUCTS) ×2 IMPLANT
GLIDESHEATH SLEND SS 6F .021 (SHEATH) ×2 IMPLANT
GUIDE CATH RUNWAY 6FR CLS3.5 (CATHETERS) ×2 IMPLANT
KIT ENCORE 26 ADVANTAGE (KITS) ×2 IMPLANT
KIT HEART LEFT (KITS) ×2 IMPLANT
PACK CARDIAC CATHETERIZATION (CUSTOM PROCEDURE TRAY) ×2 IMPLANT
STENT SYNERGY DES 2.25X12 (Permanent Stent) ×2 IMPLANT
STENT SYNERGY DES 3.5X32 (Permanent Stent) ×2 IMPLANT
SYR MEDRAD MARK V 150ML (SYRINGE) ×2 IMPLANT
TRANSDUCER W/STOPCOCK (MISCELLANEOUS) ×2 IMPLANT
TUBING CIL FLEX 10 FLL-RA (TUBING) ×2 IMPLANT
VALVE GUARDIAN II ~~LOC~~ HEMO (MISCELLANEOUS) ×2 IMPLANT
WIRE ASAHI PROWATER 180CM (WIRE) ×2 IMPLANT
WIRE SAFE-T 1.5MM-J .035X260CM (WIRE) ×2 IMPLANT

## 2015-12-11 NOTE — H&P (View-Only) (Signed)
    Subjective:  Denies CP or dyspnea   Objective:  Vitals:   12/10/15 2145 12/10/15 2223 12/10/15 2348 12/11/15 0345  BP: 172/73 (!) 180/82 (!) 156/82 (!) 156/75  Pulse: 63 64  63  Resp: 15 16  16  Temp:  97.9 F (36.6 C)  97.7 F (36.5 C)  TempSrc:  Oral  Oral  SpO2: 95% 99%  97%  Weight:  246 lb 6.4 oz (111.8 kg)  247 lb 3.2 oz (112.1 kg)  Height:  5' 6" (1.676 m)      Intake/Output from previous day: No intake or output data in the 24 hours ending 12/11/15 0744  Physical Exam: Physical exam: Well-developed obese in no acute distress.  Skin is warm and dry.  HEENT is normal.  Neck is supple.  Chest is clear to auscultation with normal expansion.  Cardiovascular exam is regular rate and rhythm.  Abdominal exam nontender or distended. No masses palpated. Extremities show no edema. neuro grossly intact    Lab Results: Basic Metabolic Panel:  Recent Labs  12/10/15 1657 12/11/15 0338  NA 137 141  K 3.3* 3.3*  CL 100* 106  CO2 28 27  GLUCOSE 93 104*  BUN 17 15  CREATININE 1.13* 0.92  CALCIUM 9.6 9.3   CBC:  Recent Labs  12/10/15 1657 12/11/15 0338  WBC 7.6 7.2  HGB 13.3 12.9  HCT 40.6 40.2  MCV 85.8 84.5  PLT 287 274   Cardiac Enzymes:  Recent Labs  12/10/15 2021 12/10/15 2335 12/11/15 0338  TROPONINI 0.12* 0.10* 0.09*     Assessment/Plan:  1 NSTEMI-troponin elevated; continue ASA, heparin, statin and coreg; for cath today; risks and benefits discussed and pt agrees to proceed. 2 HTN-BP elevated; resume lisinopril following cath and follow. 3 Hyperlipidemia-statin changed to lipitor; lipids and liver in 4 weeks 4 Hypokalemia-supplement 5 DM  Brian Crenshaw 12/11/2015, 7:44 AM   

## 2015-12-11 NOTE — Progress Notes (Signed)
ANTICOAGULATION CONSULT NOTE Pharmacy Consult for heparin Indication: chest pain/ACS  Allergies  Allergen Reactions  . Codeine     "get a little crazy"  . Penicillins Rash    Has patient had a PCN reaction causing immediate rash, facial/tongue/throat swelling, SOB or lightheadedness with hypotension: Yes Has patient had a PCN reaction causing severe rash involving mucus membranes or skin necrosis: No Has patient had a PCN reaction that required hospitalization No Has patient had a PCN reaction occurring within the last 10 years: No If all of the above answers are "NO", then may proceed with Cephalosporin use.     Patient Measurements: Height: 5\' 6"  (167.6 cm) Weight: 247 lb 3.2 oz (112.1 kg) IBW/kg (Calculated) : 59.3 Heparin Dosing Weight: 85  Vital Signs: Temp: 97.7 F (36.5 C) (08/04 0345) Temp Source: Oral (08/04 0345) BP: 156/75 (08/04 0345) Pulse Rate: 63 (08/04 0345)  Labs:  Recent Labs  12/10/15 1657 12/10/15 2021 12/10/15 2335 12/11/15 0338 12/11/15 0350  HGB 13.3  --   --  12.9  --   HCT 40.6  --   --  40.2  --   PLT 287  --   --  274  --   APTT  --  32  --   --   --   LABPROT  --  13.8  --  14.0  --   INR  --  1.06  --  1.07  --   HEPARINUNFRC  --   --   --   --  0.41  CREATININE 1.13*  --   --   --   --   TROPONINI  --  0.12* 0.10*  --   --     Estimated Creatinine Clearance: 62.2 mL/min (by C-G formula based on SCr of 1.13 mg/dL).  Assessment: 65 y.o. female with chest pain for heparin    Goal of Therapy:  Heparin level 0.3-0.7 units/ml Monitor platelets by anticoagulation protocol: Yes   Plan:  Continue Heparin at current rate  F/U after cath today  Geannie Risen, PharmD, BCPS

## 2015-12-11 NOTE — Progress Notes (Signed)
Lab result shows a K+ of 3.3, Fellow on call paged.  Colleen Can, RN

## 2015-12-11 NOTE — Care Management Note (Signed)
Case Management Note  Patient Details  Name: Jane Hall MRN: [id number] Date of Birth: 1951/03/06  Subjective/Objective:  Pt presented with CP-Nstemi. Initiated on IV Heparin Gtt. Cardiac cath planned for 12-11-15.                   Action/Plan: CM will continue to monitor for disposition needs.   Expected Discharge Date:                  Expected Discharge Plan:  Home/Self Care  In-House Referral:     Discharge planning Services  CM Consult  Post Acute Care Choice:    Choice offered to:     DME Arranged:    DME Agency:     HH Arranged:    HH Agency:     Status of Service:  In process, will continue to follow  If discussed at Long Length of Stay Meetings, dates discussed:    Additional Comments:  Gala Lewandowsky, RN 12/11/2015, 8:11 AM

## 2015-12-11 NOTE — Progress Notes (Signed)
Patient received and oriented to room. Tele monitoring call placed, call light within reach. No pain at this time. Will continue to monitor hematoma, has decreased in size and is marked.

## 2015-12-11 NOTE — Progress Notes (Signed)
  Echocardiogram 2D Echocardiogram has been performed.  Jane Hall 12/11/2015, 3:46 PM

## 2015-12-11 NOTE — Interval H&P Note (Signed)
Cath Lab Visit (complete for each Cath Lab visit)  Clinical Evaluation Leading to the Procedure:   ACS: Yes.    Non-ACS:    Anginal Classification: CCS IV  Anti-ischemic medical therapy: Minimal Therapy (1 class of medications)  Non-Invasive Test Results: No non-invasive testing performed  Prior CABG: No previous CABG  Prior PTCA.    History and Physical Interval Note:  12/11/2015 8:02 AM  Jane Hall  has presented today for surgery, with the diagnosis of NSTEMI  The various methods of treatment have been discussed with the patient and family. After consideration of risks, benefits and other options for treatment, the patient has consented to  Procedure(s): Left Heart Cath and Coronary Angiography (N/A) as a surgical intervention .  The patient's history has been reviewed, patient examined, no change in status, stable for surgery.  I have reviewed the patient's chart and labs.  Questions were answered to the patient's satisfaction.     Lance Muss

## 2015-12-11 NOTE — Care Management Note (Addendum)
Case Management Note  Patient Details  Name: Jane Hall MRN: [id number] Date of Birth: Nov 24, 1950  Subjective/Objective:     Patient is s/p coronary stent intervention, will be on Brilinta per RN, NCM awaiting benefit check for Brilinta.        S/W [facility] @ [facility] # [phone]   BRILINTA 90 MG BID ( 30 )  COVER- YES  CO-PAY- $ 47.00  TIER- 3 DRUG  PRIOR APPROVAL - YES # [phone]  PHARMACY : [facility] , [facility] AND [facility]                Action/Plan:   Expected Discharge Date:                  Expected Discharge Plan:  Home/Self Care  In-House Referral:     Discharge planning Services  CM Consult  Post Acute Care Choice:    Choice offered to:     DME Arranged:    DME Agency:     HH Arranged:    HH Agency:     Status of Service:  In process, will continue to follow  If discussed at Long Length of Stay Meetings, dates discussed:    Additional Comments:  Leone Haven, RN 12/11/2015, 3:20 PM

## 2015-12-11 NOTE — Progress Notes (Signed)
    Subjective:  Denies CP or dyspnea   Objective:  Vitals:   12/10/15 2145 12/10/15 2223 12/10/15 2348 12/11/15 0345  BP: 172/73 (!) 180/82 (!) 156/82 (!) 156/75  Pulse: 63 64  63  Resp: 15 16  16   Temp:  97.9 F (36.6 C)  97.7 F (36.5 C)  TempSrc:  Oral  Oral  SpO2: 95% 99%  97%  Weight:  246 lb 6.4 oz (111.8 kg)  247 lb 3.2 oz (112.1 kg)  Height:  5\' 6"  (1.676 m)      Intake/Output from previous day: No intake or output data in the 24 hours ending 12/11/15 0744  Physical Exam: Physical exam: Well-developed obese in no acute distress.  Skin is warm and dry.  HEENT is normal.  Neck is supple.  Chest is clear to auscultation with normal expansion.  Cardiovascular exam is regular rate and rhythm.  Abdominal exam nontender or distended. No masses palpated. Extremities show no edema. neuro grossly intact    Lab Results: Basic Metabolic Panel:  Recent Labs  36/64/40 1657 12/11/15 0338  NA 137 141  K 3.3* 3.3*  CL 100* 106  CO2 28 27  GLUCOSE 93 104*  BUN 17 15  CREATININE 1.13* 0.92  CALCIUM 9.6 9.3   CBC:  Recent Labs  12/10/15 1657 12/11/15 0338  WBC 7.6 7.2  HGB 13.3 12.9  HCT 40.6 40.2  MCV 85.8 84.5  PLT 287 274   Cardiac Enzymes:  Recent Labs  12/10/15 2021 12/10/15 2335 12/11/15 0338  TROPONINI 0.12* 0.10* 0.09*     Assessment/Plan:  1 NSTEMI-troponin elevated; continue ASA, heparin, statin and coreg; for cath today; risks and benefits discussed and pt agrees to proceed. 2 HTN-BP elevated; resume lisinopril following cath and follow. 3 Hyperlipidemia-statin changed to lipitor; lipids and liver in 4 weeks 4 Hypokalemia-supplement 5 DM  Jane Hall 12/11/2015, 7:44 AM

## 2015-12-11 NOTE — Progress Notes (Addendum)
TR BAND REMOVAL  LOCATION:  right radial  DEFLATED PER PROTOCOL:  Yes.    TIME BAND OFF / DRESSING APPLIED:   1600   SITE UPON ARRIVAL:   Level 2  SITE AFTER BAND REMOVAL:  Level 2  CIRCULATION SENSATION AND MOVEMENT:  Within Normal Limits  No.  COMMENTS:  Patient arrived with radial band in place, and 18 cc of air.  Level 2 for bruise and hematoma (4 cm x 3 cm)  proximal to site.  Held manual pressure from 1020 - 1030 followed by manual pressure from 1030 -1040 by Ledell Noss.  Hematoma grew once again and manual pressure applied from 1310 - 1320, at which time band was repositioned by Lance Bosch and myself.  Dr. Eldridge Dace called at that time as well to examine patient, but was in a case.  At that time air content was reduced from the original 18 cc to 12cc.  I called Dr. Eldridge Dace at 1600 to request he examine patient.  Most of forearm remains tight and larger than the left arm.  Arm remained elevated on two pillows and with ice applied throughout afternoon and evening. Patient has never complained of discomfort, and pleth and pulse oximetry showed nothing outside of normal limits.  Cannot assess capillary refill secondary to nail polish, but hand returned to normal color and temperature after band completely deflated.

## 2015-12-12 LAB — CBC
HEMATOCRIT: 37.4 % (ref 36.0–46.0)
Hemoglobin: 12 g/dL (ref 12.0–15.0)
MCH: 27.3 pg (ref 26.0–34.0)
MCHC: 32.1 g/dL (ref 30.0–36.0)
MCV: 85.2 fL (ref 78.0–100.0)
Platelets: 292 10*3/uL (ref 150–400)
RBC: 4.39 MIL/uL (ref 3.87–5.11)
RDW: 13.9 % (ref 11.5–15.5)
WBC: 8.3 10*3/uL (ref 4.0–10.5)

## 2015-12-12 LAB — BASIC METABOLIC PANEL
ANION GAP: 7 (ref 5–15)
BUN: 16 mg/dL (ref 6–20)
CHLORIDE: 106 mmol/L (ref 101–111)
CO2: 23 mmol/L (ref 22–32)
Calcium: 9.2 mg/dL (ref 8.9–10.3)
Creatinine, Ser: 0.91 mg/dL (ref 0.44–1.00)
GLUCOSE: 118 mg/dL — AB (ref 65–99)
POTASSIUM: 3.8 mmol/L (ref 3.5–5.1)
Sodium: 136 mmol/L (ref 135–145)

## 2015-12-12 LAB — HEMOGLOBIN A1C
Hgb A1c MFr Bld: 6 % — ABNORMAL HIGH (ref 4.8–5.6)
MEAN PLASMA GLUCOSE: 126 mg/dL

## 2015-12-12 MED ORDER — TICAGRELOR 90 MG PO TABS: 90.0000 mg | ORAL_TABLET | Freq: Two times a day (BID) | ORAL | 0 refills | Status: AC

## 2015-12-12 MED ORDER — NITROGLYCERIN 0.4 MG SL SUBL: 0.4000 mg | SUBLINGUAL_TABLET | SUBLINGUAL | 0 refills | Status: AC | PRN

## 2015-12-12 MED ORDER — ATORVASTATIN CALCIUM 80 MG PO TABS: 80.0000 mg | ORAL_TABLET | Freq: Every day | ORAL | 0 refills | Status: AC

## 2015-12-12 MED ORDER — LISINOPRIL 40 MG PO TABS: 40.0000 mg | ORAL_TABLET | Freq: Every day | ORAL | Status: DC

## 2015-12-12 NOTE — Progress Notes (Signed)
Pt has Brilinta 30 day free trial card. Isidoro Donning RN CCM Case Mgmt phone [phone]

## 2015-12-12 NOTE — Progress Notes (Signed)
    Subjective:  Denies CP or dyspnea   Objective:  Vitals:   12/11/15 1650 12/11/15 1943 12/11/15 2007 12/12/15 0410  BP: (!) 166/93 115/72  137/73  Pulse:  82  71  Resp: 17 16  18   Temp: 97.7 F (36.5 C) 97.5 F (36.4 C)  98.2 F (36.8 C)  TempSrc: Oral Oral  Oral  SpO2: 98% 98% 98% 96%  Weight:      Height:        Intake/Output from previous day:  Intake/Output Summary (Last 24 hours) at 12/12/15 0810 Last data filed at 12/11/15 1812  Gross per 24 hour  Intake            928.4 ml  Output                0 ml  Net            928.4 ml    Physical Exam: Physical exam: Well-developed obese in no acute distress.  Skin is warm and dry.  HEENT is normal.  Neck is supple.  Chest is clear to auscultation with normal expansion.  Cardiovascular exam is regular rate and rhythm.  Abdominal exam nontender or distended. No masses palpated. Extremities show no edema. Radial cath site with no hematoma; mild ecchymosis neuro grossly intact    Lab Results: Basic Metabolic Panel:  Recent Labs  67/59/16 0338 12/12/15 0246  NA 141 136  K 3.3* 3.8  CL 106 106  CO2 27 23  GLUCOSE 104* 118*  BUN 15 16  CREATININE 0.92 0.91  CALCIUM 9.3 9.2   CBC:  Recent Labs  12/11/15 0338 12/12/15 0246  WBC 7.2 8.3  HGB 12.9 12.0  HCT 40.2 37.4  MCV 84.5 85.2  PLT 274 292   Cardiac Enzymes:  Recent Labs  12/10/15 2021 12/10/15 2335 12/11/15 0338  TROPONINI 0.12* 0.10* 0.09*     Assessment/Plan:  1 NSTEMI-status post PCI of circumflex and LAD. Continue asa, brilinta (12 months) and statin.  2 HTN-BP elevated; resume lisinopril at DC and continue coreg. 3 Hyperlipidemia-statin changed to lipitor; lipids and liver in 4 weeks 4 Hypokalemia-resolved 5 DM Plan discharge today and follow-up with her cardiologist in [place] in 2-4 weeks. >30 min PA and physician time D2 Olga Millers 12/12/2015, 8:10 AM

## 2015-12-12 NOTE — Discharge Summary (Signed)
Discharge Summary    Patient ID: Jane Hall,  MRN: [id number], DOB/AGE: 67-Feb-1952 67 y.o.  Admit date: 12/10/2015 Discharge date: 12/12/2015  Primary Care Provider: No primary care provider on file. Primary Cardiologist: Live in [place] and will follow up with cardiologist there.   Discharge Diagnoses    Principal Problem:   NSTEMI (non-ST elevated myocardial infarction) (HCC) Active Problems:   CAD (coronary artery disease)   HTN (hypertension)   HLD (hyperlipidemia)   Diabetes mellitus, type 2 (HCC)   Allergies Allergies  Allergen Reactions  . Codeine     "get a little crazy"  . Penicillins Rash    Has patient had a PCN reaction causing immediate rash, facial/tongue/throat swelling, SOB or lightheadedness with hypotension: Yes Has patient had a PCN reaction causing severe rash involving mucus membranes or skin necrosis: No Has patient had a PCN reaction that required hospitalization No Has patient had a PCN reaction occurring within the last 10 years: No If all of the above answers are "NO", then may proceed with Cephalosporin use.      History of Present Illness     65 year old female with history of CAD, hypertension, hyperlipidemia and diabetes who presented to [facility] emergency department with chest pain and ruled in for NSTEMI.  She was in [place] visiting from out of town for the national swim meet to watch her grandson swimming. She reported a history of prior angioplasty only but did not know of which vessel. On admission she reported that she had experienced exertional chest comfort for several days. On 12/10/15 she had a recurrent episode that was more severe and lasted for about 30 minutes, prompting her to report to the [facility] emergency department. In the ED, point care troponin is abnormal at 0.10. EKG showed sinus rhythm with borderline T-wave abnormalities in the lateral leads but there were no previous EKGs for comparison.  Hospital  Course     Consultants: none  NSTEMI: peak troponin 0.12. Full cath report below. S/p PCI of circumflex and LAD on 12/11/15. Continue asa, brilinta (12 months) and statin.   HTN: BP elevated; resume lisinopril at DC and continue coreg  BID  Hyperlipidemia: LDL 73. Statin changed to lipitor; lipids and liver in 4 weeks  Hypokalemia: repleted   DMT2: HgA1c 6.0. Hold metformin 48 hours after cath    Dispo: plan discharge today and follow-up with her cardiologist in [place] in 2-4 weeks.   The patient has had an uncomplicated hospital course and is recovering well. The radial catheter site is stable She has been seen by Dr. Jens Som today and deemed ready for discharge home. A written RX for a 30 day free supply of Brilinta was provided for the patient.  Discharge medications are listed below.  _____________  Discharge Vitals Blood pressure (!) 134/57, pulse 71, temperature 98.2 F (36.8 C), temperature source Oral, resp. rate 18, height  (1.676 m), weight 247 lb 3.2 oz (112.1 kg), SpO2 96 %.  Filed Weights   12/10/15 2011 12/10/15 2223 12/11/15 0345  Weight: 246 lb (111.6 kg) 246 lb 6.4 oz (111.8 kg) 247 lb 3.2 oz (112.1 kg)    Labs & Radiologic Studies     CBC  Recent Labs  12/11/15 0338 12/12/15 0246  WBC 7.2 8.3  HGB 12.9 12.0  HCT 40.2 37.4  MCV 84.5 85.2  PLT 274 292   Basic Metabolic Panel  Recent Labs  12/11/15 0338 12/12/15 0246  NA 141  136  K 3.3* 3.8  CL 106 106  CO2 27 23  GLUCOSE 104* 118*  BUN 15 16  CREATININE 0.92 0.91  CALCIUM 9.3 9.2   Liver Function Tests No results for input(s): AST, ALT, ALKPHOS, BILITOT, PROT, ALBUMIN in the last 72 hours. No results for input(s): LIPASE, AMYLASE in the last 72 hours. Cardiac Enzymes  Recent Labs  12/10/15 2021 12/10/15 2335 02-Jan-2016 0338  TROPONINI 0.12* 0.10* 0.09*   BNP Invalid input(s): POCBNP D-Dimer No results for input(s): DDIMER in the last 72 hours. Hemoglobin A1C  Recent  Labs  12/10/15 2336  HGBA1C 6.0*   Fasting Lipid Panel  Recent Labs  01/02/16 0338  CHOL 165  HDL 41  LDLCALC 73  TRIG 257*  CHOLHDL 4.0   Thyroid Function Tests No results for input(s): TSH, T4TOTAL, T3FREE, THYROIDAB in the last 72 hours.  Invalid input(s): FREET3  Dg Chest 2 View  Result Date: 12/10/2015 CLINICAL DATA:  Chest pain. EXAM: CHEST  2 VIEW COMPARISON:  None. FINDINGS: Normal sized heart. Clear lungs. Small calcified granuloma in the lingula. Mild thoracic spine degenerative changes. IMPRESSION: No acute abnormality. Electronically Signed   By: Beckie Salts M.D.   On: 12/10/2015 18:07     Diagnostic Studies/Procedures    Procedures 01-02-16 Coronary Stent Intervention  Left Heart Cath and Coronary Angiography  Conclusion    Dist Cx lesion, 80 %stenosed. A STENT SYNERGY DES 2.25X12 drug eluting stent was successfully placed.  Post intervention, there is a 0% residual stenosis.  Mid LAD lesion, 95 %stenosed. A STENT SYNERGY DES 3.5X32 drug eluting stent was successfully placed, postdilated to 3.8 mm. Post intervention, there is a 0% residual stenosis.  The left ventricular systolic function is normal.  The left ventricular ejection fraction is 55-65% by visual estimate.  LV end diastolic pressure is normal.  There is no aortic valve stenosis.  Continue dual antiplatelet therapy for at least a year along with aggressive secondary prevention.  IV tirofiban for 3 hours post procedure.     _____________  2D ECHO: 01-02-16 LV EF: 55% -   60% Study Conclusions - Left ventricle: The cavity size was normal. Wall thickness was   increased in a pattern of mild LVH. Systolic function was normal.   The estimated ejection fraction was in the range of 55% to 60%.   Wall motion was normal; there were no regional wall motion   abnormalities. Doppler parameters are consistent with abnormal   left ventricular relaxation (grade 1 diastolic dysfunction). The    E/e&' ratio is between 8-15, suggesting indeterminate LV filling   pressure. - Left atrium: The atrium was normal in size. - Inferior vena cava: The vessel was normal in size. The   respirophasic diameter changes were in the normal range (>= 50%),   consistent with normal central venous pressure. Impressions: - LVEF 55-60%, mild LVH, normal wall motion, diastolic dysfunction,   indeterminate LV filling pressure, normal LA size, normal IVC.   Disposition   Pt is being discharged home today in good condition.  Follow-up Plans & Appointments     Discharge Instructions    Amb Referral to Cardiac Rehabilitation    Complete by:  As directed   Diagnosis:   NSTEMI Comment - To [place], [place] Coronary Stents        Discharge Medications     Medication List    STOP taking these medications   pravastatin 40 MG tablet Commonly known as:  PRAVACHOL  TAKE these medications   aspirin EC 81 MG EC tablet Generic drug:  aspirin Take 81 mg by mouth at bedtime. Swallow whole.   atorvastatin 80 MG tablet Commonly known as:  LIPITOR Take 1 tablet (80 mg total) by mouth daily at 6 PM.   CALCIUM 500 + D PO Take 1 tablet by mouth 2 (two) times daily.   carvedilol 25 MG tablet Commonly known as:  COREG Take 25 mg by mouth 2 (two) times daily with a meal.   cetirizine 10 MG tablet Commonly known as:  ZYRTEC Take 10 mg by mouth at bedtime.   FLUoxetine 20 MG tablet Commonly known as:  PROZAC Take 40 mg by mouth every morning.   fluticasone 50 MCG/ACT nasal spray Commonly known as:  FLONASE Place 1 spray into both nostrils daily as needed for allergies or rhinitis.   Fluticasone-Salmeterol 250-50 MCG/DOSE Aepb Commonly known as:  ADVAIR Inhale 1 puff into the lungs 2 (two) times daily.   lisinopril 40 MG tablet Commonly known as:  PRINIVIL,ZESTRIL Take 40 mg by mouth every evening.   metFORMIN 500 MG tablet Commonly known as:  GLUCOPHAGE Take 500 mg by mouth daily  with breakfast.   naproxen sodium 220 MG tablet Commonly known as:  ANAPROX Take 440 mg by mouth 2 (two) times daily with a meal.   nitroGLYCERIN 0.4 MG SL tablet Commonly known as:  NITROSTAT Place 1 tablet (0.4 mg total) under the tongue every 5 (five) minutes x 3 doses as needed for chest pain.   ONE-A-DAY 50 PLUS PO Take 1 tablet by mouth daily.   ticagrelor 90 MG Tabs tablet Commonly known as:  BRILINTA Take 1 tablet (90 mg total) by mouth 2 (two) times daily.       Aspirin prescribed at discharge?  Yes High Intensity Statin Prescribed? (Lipitor 40-80mg  or Crestor 20-40mg ): Yes Beta Blocker Prescribed? Yes For EF 45% or less, Was ACEI/ARB Prescribed? Yes, EF okay ADP Receptor Inhibitor Prescribed? (i.e. Plavix etc.-Includes Medically Managed Patients): Yes For EF <45%, Aldosterone Inhibitor Prescribed? No: EF normal  Was EF assessed during THIS hospitalization? Yes Was Cardiac Rehab II ordered? (Included Medically managed Patients): Yes   Outstanding Labs/Studies   none  Duration of Discharge Encounter   Greater than 30 minutes including physician time.  Signed, Cline Crock PA-C 12/12/2015, 10:14 AM

## 2015-12-12 NOTE — Progress Notes (Signed)
CARDIAC REHAB PHASE I   PRE:  Rate/Rhythm: 84 SR  BP:  Sitting: 127/77        SaO2: 99 RA  MODE:  Ambulation: 400 ft   POST:  Rate/Rhythm: 95 SR  BP:  Sitting: 161/93         SaO2: 98 RA  Pt ambulated 400 ft on RA, handheld assist, steady gait, tolerated well with no complaints. Completed MI/stent education.  Reviewed risk factors, anti-platelet therapy, stent card, activity restrictions, ntg, exercise, heart healthy diet, carb counting, portion control, and phase 2 cardiac rehab. Pt verbalized understanding, receptive to education. Pt agrees to phase 2 cardiac rehab referral, pt lives in [place], will send referral to [facility] in [place], [place]. Pt to edge of bed per pt request after walk, call bell within reach. Pt eager for discharge.  [id number] Joylene Grapes, RN, BSN 12/12/2015 8:37 AM

## 2015-12-12 NOTE — Discharge Instructions (Signed)

## 2016-01-08 ENCOUNTER — Other Ambulatory Visit: Payer: Self-pay | Admitting: Physician Assistant

## 2016-01-08 NOTE — Telephone Encounter (Signed)
Was not sure if ok to refill again or if ok for how long as per the following Discharge Summary    Patient ID: Jane Hall,  MRN: [id number], DOB/AGE: [birth year] 65 y.o.  Admit date: 12/10/2015 Discharge date: 12/12/2015  Primary Care Provider: No primary care provider on file. Primary Cardiologist: Live in [place] and will follow up with cardiologist there.   Please advise. Thanks, MI

## 2016-04-12 ENCOUNTER — Other Ambulatory Visit: Payer: Self-pay | Admitting: Physician Assistant

## 2017-08-30 IMAGING — DX DG CHEST 2V
2 series · 2 of 2 positions shown · non-contrast
Comparison: None.

CLINICAL DATA: Chest pain.

EXAM:
CHEST  2 VIEW

[chest pa]
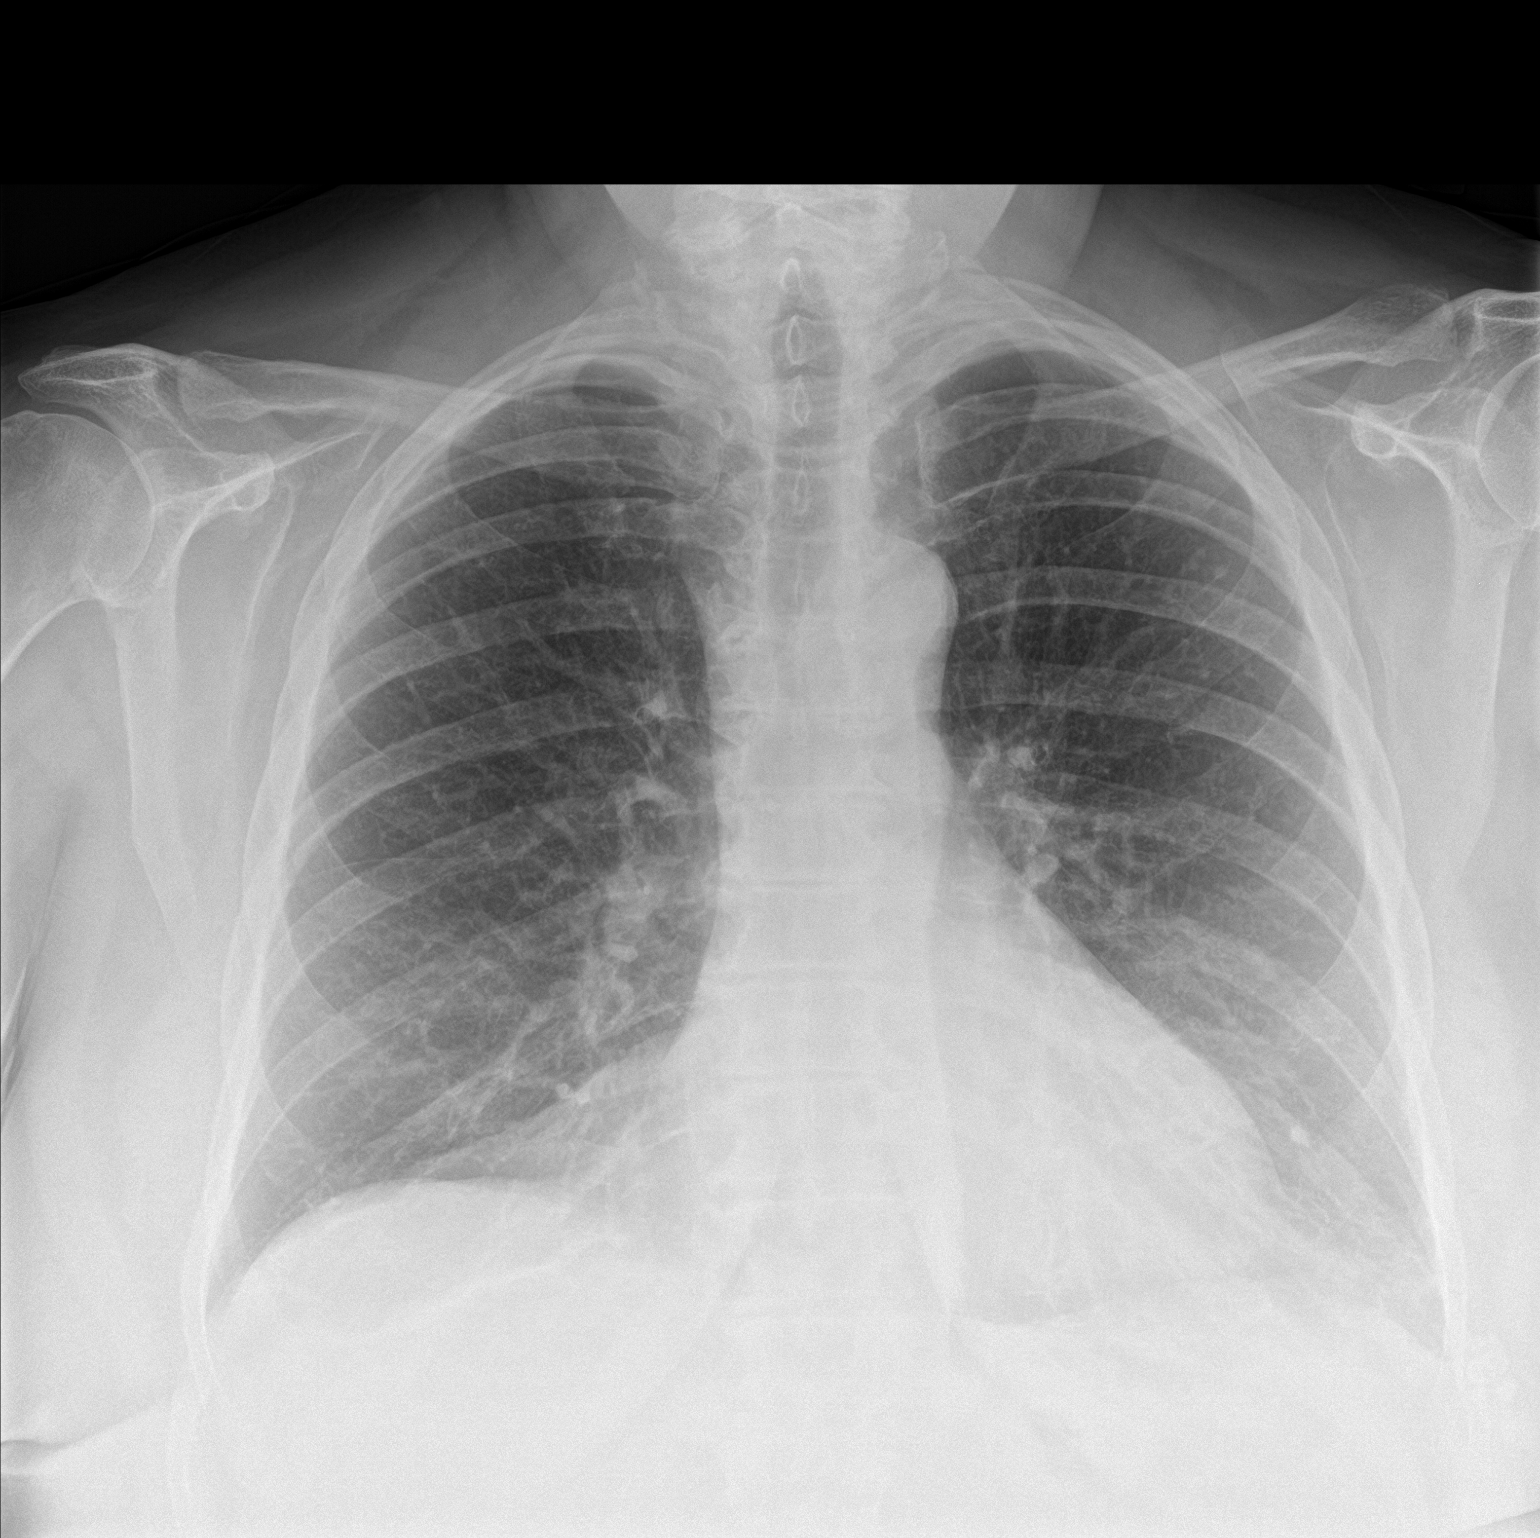

[chest lat]
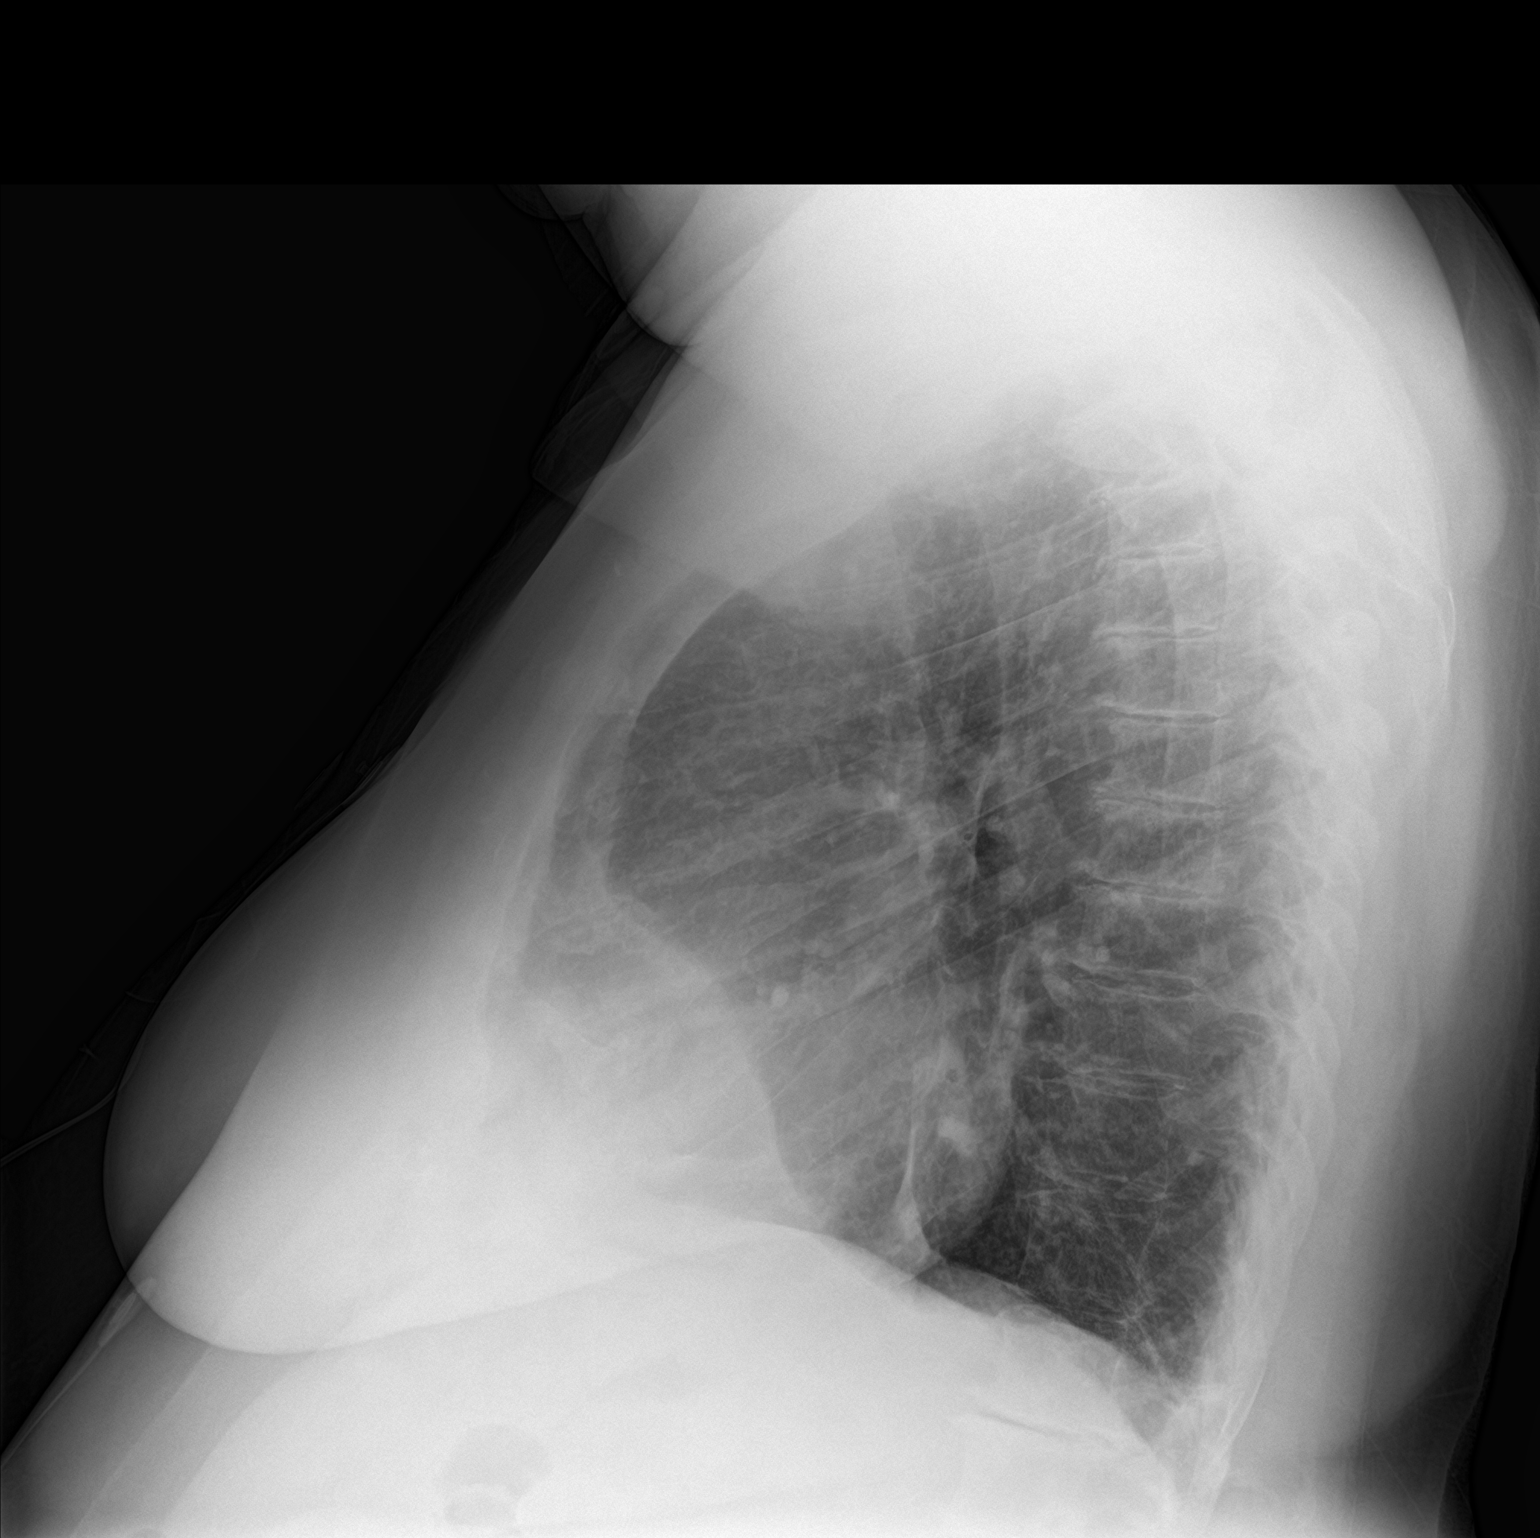

[2 of 2 positions shown; findings below may reference images not displayed]

FINDINGS: Normal sized heart. Clear lungs. Small calcified granuloma in the
lingula. Mild thoracic spine degenerative changes.
IMPRESSION: No acute abnormality.
